# Patient Record
Sex: Male | Born: 1970 | Race: White | Hispanic: No | Marital: Married
Health system: Southern US, Community
[De-identification: ages and names within clinical notes are randomized; demographics above are authoritative.]

## PROBLEM LIST (undated history)

## (undated) DIAGNOSIS — L309 Dermatitis, unspecified: Secondary | ICD-10-CM

## (undated) DIAGNOSIS — E7800 Pure hypercholesterolemia, unspecified: Secondary | ICD-10-CM

## (undated) DIAGNOSIS — M549 Dorsalgia, unspecified: Secondary | ICD-10-CM

## (undated) DIAGNOSIS — K5090 Crohn's disease, unspecified, without complications: Secondary | ICD-10-CM

## (undated) DIAGNOSIS — J302 Other seasonal allergic rhinitis: Secondary | ICD-10-CM

## (undated) DIAGNOSIS — G43909 Migraine, unspecified, not intractable, without status migrainosus: Secondary | ICD-10-CM

## (undated) DIAGNOSIS — F329 Major depressive disorder, single episode, unspecified: Secondary | ICD-10-CM

## (undated) DIAGNOSIS — R7989 Other specified abnormal findings of blood chemistry: Secondary | ICD-10-CM

## (undated) DIAGNOSIS — M542 Cervicalgia: Secondary | ICD-10-CM

## (undated) DIAGNOSIS — I10 Essential (primary) hypertension: Secondary | ICD-10-CM

## (undated) DIAGNOSIS — R7301 Impaired fasting glucose: Secondary | ICD-10-CM

## (undated) DIAGNOSIS — J45909 Unspecified asthma, uncomplicated: Secondary | ICD-10-CM

## (undated) DIAGNOSIS — E669 Obesity, unspecified: Secondary | ICD-10-CM

## (undated) HISTORY — DX: Pure hypercholesterolemia, unspecified: E78.00

## (undated) HISTORY — DX: Dorsalgia, unspecified: M54.9

## (undated) HISTORY — DX: Other seasonal allergic rhinitis: J30.2

## (undated) HISTORY — DX: Obesity, unspecified: E66.9

## (undated) HISTORY — DX: Essential (primary) hypertension: I10

## (undated) HISTORY — DX: Migraine, unspecified, not intractable, without status migrainosus: G43.909

## (undated) HISTORY — DX: Dermatitis, unspecified: L30.9

## (undated) HISTORY — DX: Impaired fasting glucose: R73.01

## (undated) HISTORY — DX: Unspecified asthma, uncomplicated: J45.909

## (undated) HISTORY — DX: Major depressive disorder, single episode, unspecified: F32.9

## (undated) HISTORY — DX: Cervicalgia: M54.2

## (undated) HISTORY — DX: Crohn's disease, unspecified, without complications: K50.90

## (undated) HISTORY — DX: Other specified abnormal findings of blood chemistry: R79.89

---

## 2008-09-03 HISTORY — PX: COLONOSCOPY: SHX000273

## 2016-07-24 ENCOUNTER — Telehealth (HOSPITAL_BASED_OUTPATIENT_CLINIC_OR_DEPARTMENT_OTHER): Payer: Self-pay | Admitting: Family Medicine

## 2016-07-24 MED ORDER — TESTOSTERONE CYPIONATE 200 MG/ML INTRAMUSCULAR OIL
130.00 mg | TOPICAL_OIL | INTRAMUSCULAR | 0 refills | Status: DC
Start: 2016-07-24 — End: 2016-10-26

## 2016-07-24 NOTE — Telephone Encounter (Signed)
Pt states he was flying back yesterday and had his vial of testosterone in his carry on luggage and the vial broke. He is wondering if you would be able to refill? Last refill was 05/04/16 x5

## 2016-07-24 NOTE — Telephone Encounter (Signed)
Let him know that I normally do not authorize early refill as this is a controlled substance.  Will do it one time for him.  He will likely need to pay cash as it means getting more than authorized per month by ins.   Will fax rx for one additional vial.

## 2016-07-25 NOTE — Telephone Encounter (Signed)
Pt.notified

## 2016-07-30 ENCOUNTER — Other Ambulatory Visit (HOSPITAL_BASED_OUTPATIENT_CLINIC_OR_DEPARTMENT_OTHER): Payer: Self-pay | Admitting: Family Medicine

## 2016-07-30 NOTE — Telephone Encounter (Signed)
Last appt 04/27/16,next appt N/A, last refill 05/11/16 #90

## 2016-08-10 ENCOUNTER — Other Ambulatory Visit (HOSPITAL_BASED_OUTPATIENT_CLINIC_OR_DEPARTMENT_OTHER): Payer: Self-pay | Admitting: Family Medicine

## 2016-08-10 NOTE — Telephone Encounter (Signed)
Last appt 04/27/16, next appt N/A, last refill 08/17/15 #90 x3, last lab 04/20/16

## 2016-09-21 ENCOUNTER — Encounter (HOSPITAL_BASED_OUTPATIENT_CLINIC_OR_DEPARTMENT_OTHER): Payer: Self-pay | Admitting: Family Medicine

## 2016-09-21 ENCOUNTER — Ambulatory Visit (HOSPITAL_BASED_OUTPATIENT_CLINIC_OR_DEPARTMENT_OTHER): Payer: BLUE CROSS/BLUE SHIELD | Admitting: Family Medicine

## 2016-09-21 VITALS — BP 132/78 | HR 80 | Temp 97.8°F | Resp 9 | Ht 70.0 in | Wt 241.0 lb

## 2016-09-21 DIAGNOSIS — F32A Depression, unspecified: Secondary | ICD-10-CM | POA: Insufficient documentation

## 2016-09-21 DIAGNOSIS — E669 Obesity, unspecified: Secondary | ICD-10-CM | POA: Insufficient documentation

## 2016-09-21 DIAGNOSIS — R7989 Other specified abnormal findings of blood chemistry: Secondary | ICD-10-CM | POA: Insufficient documentation

## 2016-09-21 DIAGNOSIS — J040 Acute laryngitis: Principal | ICD-10-CM

## 2016-09-21 DIAGNOSIS — F329 Major depressive disorder, single episode, unspecified: Secondary | ICD-10-CM

## 2016-09-21 DIAGNOSIS — J302 Other seasonal allergic rhinitis: Secondary | ICD-10-CM | POA: Insufficient documentation

## 2016-09-21 DIAGNOSIS — L309 Dermatitis, unspecified: Secondary | ICD-10-CM | POA: Insufficient documentation

## 2016-09-21 DIAGNOSIS — K5090 Crohn's disease, unspecified, without complications: Secondary | ICD-10-CM | POA: Insufficient documentation

## 2016-09-21 DIAGNOSIS — J45909 Unspecified asthma, uncomplicated: Secondary | ICD-10-CM | POA: Insufficient documentation

## 2016-09-21 DIAGNOSIS — M549 Dorsalgia, unspecified: Secondary | ICD-10-CM | POA: Insufficient documentation

## 2016-09-21 DIAGNOSIS — G43909 Migraine, unspecified, not intractable, without status migrainosus: Secondary | ICD-10-CM | POA: Insufficient documentation

## 2016-09-21 DIAGNOSIS — M542 Cervicalgia: Secondary | ICD-10-CM | POA: Insufficient documentation

## 2016-09-21 DIAGNOSIS — E7800 Pure hypercholesterolemia, unspecified: Secondary | ICD-10-CM | POA: Insufficient documentation

## 2016-09-21 DIAGNOSIS — I10 Essential (primary) hypertension: Secondary | ICD-10-CM | POA: Insufficient documentation

## 2016-09-21 HISTORY — DX: Dermatitis, unspecified: L30.9

## 2016-09-21 HISTORY — DX: Crohn's disease, unspecified, without complications: K50.90

## 2016-09-21 HISTORY — DX: Essential (primary) hypertension: I10

## 2016-09-21 HISTORY — DX: Other specified abnormal findings of blood chemistry: R79.89

## 2016-09-21 HISTORY — DX: Obesity, unspecified: E66.9

## 2016-09-21 HISTORY — DX: Depression, unspecified: F32.A

## 2016-09-21 HISTORY — DX: Pure hypercholesterolemia, unspecified: E78.00

## 2016-09-21 HISTORY — DX: Other seasonal allergic rhinitis: J30.2

## 2016-09-21 HISTORY — DX: Dorsalgia, unspecified: M54.9

## 2016-09-21 HISTORY — DX: Cervicalgia: M54.2

## 2016-09-21 HISTORY — DX: Unspecified asthma, uncomplicated: J45.909

## 2016-09-21 HISTORY — DX: Migraine, unspecified, not intractable, without status migrainosus: G43.909

## 2016-09-21 LAB — POC RAPID STREP A: POC RAPID STREP A: NEGATIVE

## 2016-09-21 MED ORDER — FLUTICASONE PROPIONATE 50 MCG/ACTUATION NASAL SPRAY,SUSPENSION
1.00 | Freq: Every day | NASAL | 3 refills | Status: AC
Start: 2016-09-21 — End: 2017-09-16

## 2016-09-21 NOTE — Patient Instructions (Signed)
Laryngitis: Care Instructions  Your Care Instructions     Laryngitis is an inflammation of the voice box (larynx) that causes your voice to become raspy or hoarse. It can be short-lived or long-lasting. Most of the time, laryngitis comes on quickly and lasts as long as 2 weeks. It is caused by overuse, irritation, or infection of the vocal cords inside the larynx.  Some of the most common causes are a cold, the flu, or allergies. Loud talking, shouting, cheering, or singing also can cause laryngitis. Stomach acid that backs up into the throat also can make you lose your voice.  Resting your voice and taking other steps at home can help you get your voice back.  Follow-up care is a key part of your treatment and safety. Be sure to make and go to all appointments, and call your doctor if you are having problems. It's also a good idea to know your test results and keep a list of the medicines you take.  How can you care for yourself at home?   Before you use cough and cold medicines, check the label. They may not be safe for young children or for people with certain health problems.   Try to keep stomach acid from backing up into your throat. Do not eat just before bedtime. Reduce the amount of coffee and alcohol you drink, and eat healthy foods. Taking over-the-counter acid reducers can help when these steps are not enough. In some cases, you may need prescription medicine.   Rest your voice. You do not have to stop speaking, but use your voice as little as possible. Speak softly but do not whisper; whispering can bother your larynx more than speaking softly. Avoid talking on the telephone or trying to speak loudly.   Try not to clear your throat. This can cause more irritation of your larynx. Take an over-the-counter cough suppressant (if your doctor recommends it) if you have a dry cough that does not produce mucus.   Do not smoke or allow others to smoke around you. If you need help quitting, talk to your  doctor about stop-smoking programs and medicines. These can increase your chances of quitting for good.   Use a humidifier to add moisture to your bedroom. Humidity helps to thin the mucus in the nasal membranes that causes stuffiness or postnasal drip. Follow the directions for cleaning the machine.   Drink plenty of fluids, enough so that your urine is light yellow or clear like water. If you have kidney, heart, or liver disease and have to limit fluids, talk with your doctor before you increase the amount of fluids you drink.   Relieve nasal stuffiness. A saline (saltwater) nasal wash may help. You can buy saline nose drops at a grocery store or drugstore. Or you can make your own by adding 1 teaspoon of salt and 1 teaspoon of baking soda to 2 cups of distilled water. If you make your own, fill a bulb syringe with the solution, insert the tip into your nostril, and squeeze gently. Blow your nose.  When should you call for help?  Call your doctor now or seek immediate medical care if:   You have severe pain.   You have trouble swallowing.   You cough up blood.  Watch closely for changes in your health, and be sure to contact your doctor if:   Your voice does not get better after 2 weeks of care at home.   Where can you learn more?  Go to http://blackburn.com/  Enter A905 in the search box to learn more about "Laryngitis: Care Instructions."    2006-2015 [employer]. Care instructions adapted under license by [facility]. This care instruction is for use with your licensed healthcare professional. If you have questions about a medical condition or this instruction, always ask your healthcare professional. [employer] any warranty or liability for your use of this information.  Content Version: 10.6.465758; Current as of: December 17, 2013

## 2016-09-21 NOTE — Progress Notes (Signed)
Subjective:    Patient ID: Troy Valencia is a 23yr male with PMhx HTN p/w cough    Pt states that since Sunday he has been having cough, sore throat, hoarseness in voice, runny nose, myalgias.  No fever, no vomiting or diarrhea, no CP, no SOB    Review of Systems   Constitutional: Negative for fever.   HENT: Positive for sore throat.    Musculoskeletal: Positive for myalgias.       Objective:    BP 132/78  Pulse 80  Temp 36.6 C (97.8 F) (Tympanic)  Resp 9  Ht 1.778 m (5\' 10" )  Wt 109.3 kg (241 lb)  SpO2 97%  BMI 34.58 kg/m2  Physical Exam   Constitutional: He is oriented to person, place, and time. He appears distressed.   HENT:   Nose: Right sinus exhibits no maxillary sinus tenderness and no frontal sinus tenderness. Left sinus exhibits no maxillary sinus tenderness and no frontal sinus tenderness.   - tonsils wnl  - postnasal drip present   Cardiovascular: Normal rate.    No murmur heard.  Pulmonary/Chest: Effort normal and breath sounds normal. No respiratory distress. He has no wheezes. He has no rales.   Neurological: He is alert and oriented to person, place, and time.   Nursing note and vitals reviewed.       Assessment/Plan:    [name] was seen today for cough and sore throat.    Diagnoses and all orders for this visit:    Acute laryngitis  -     Fluticasone (FLONASE) 50 mcg/actuation nasal spray; Instill 1 spray into EACH nostril every day.  -     POC RAPID STREP A  - Rapid strep negative  - Advised pt to rest voice as much as possible  - will give flonase  - I advised pt to stay hydrated and rest as much as possible as there is possibility he may have flu, however symptoms have been ongoing for more than 48 hours so tamiflu unlikely to be  effective.   - f/u if symptoms worsen

## 2016-09-25 ENCOUNTER — Other Ambulatory Visit (HOSPITAL_BASED_OUTPATIENT_CLINIC_OR_DEPARTMENT_OTHER): Payer: Self-pay | Admitting: Family Medicine

## 2016-09-26 NOTE — Telephone Encounter (Signed)
Last appt 04/27/16, next appt N/A

## 2016-10-25 ENCOUNTER — Other Ambulatory Visit (HOSPITAL_BASED_OUTPATIENT_CLINIC_OR_DEPARTMENT_OTHER): Payer: Self-pay | Admitting: Family Medicine

## 2016-10-25 NOTE — Telephone Encounter (Signed)
Last appt 04/27/16  No future appts    Metoprolol Succ ER 100mg   LW 04/30/16, 90x1    Venlafaxine HCL ER 150mg   LW 04/30/16, 90x1    Amlodipine 5mg   LW 04/30/16, 90x1    Sertraline HCL 100mg   LW 04/30/16, 90x1    Losartan/HCTZ 100-12.5mg   LW 04/30/16, 90x1  Forwarding to Dr. Arvilla Market as Dr.H out of office.

## 2016-10-26 ENCOUNTER — Other Ambulatory Visit (HOSPITAL_BASED_OUTPATIENT_CLINIC_OR_DEPARTMENT_OTHER): Payer: Self-pay | Admitting: Family Medicine

## 2016-10-26 DIAGNOSIS — R7989 Other specified abnormal findings of blood chemistry: Principal | ICD-10-CM

## 2016-10-26 NOTE — Telephone Encounter (Signed)
Refilled as requested x 1 m.  Please schedule Troy Valencia for follow up appointment.  Thank you   Needs testosterone level and PSA labs (non-fasting).

## 2016-10-26 NOTE — Telephone Encounter (Signed)
Last appt 04/27/16, next appt N/A, last refill 07/24/16, last labs 11/22/15 (testosterone levels)  Sent to covering provider

## 2016-11-13 NOTE — Telephone Encounter (Signed)
Left message to call back  And make appt and to do labs

## 2016-11-30 ENCOUNTER — Other Ambulatory Visit (HOSPITAL_BASED_OUTPATIENT_CLINIC_OR_DEPARTMENT_OTHER): Payer: Self-pay | Admitting: Family Medicine

## 2016-11-30 NOTE — Telephone Encounter (Signed)
Last written 9.27.17  lov 8.25.17  No f/u

## 2016-12-09 ENCOUNTER — Other Ambulatory Visit (HOSPITAL_BASED_OUTPATIENT_CLINIC_OR_DEPARTMENT_OTHER): Payer: Self-pay | Admitting: Family Medicine

## 2016-12-11 ENCOUNTER — Encounter (HOSPITAL_BASED_OUTPATIENT_CLINIC_OR_DEPARTMENT_OTHER): Payer: Self-pay | Admitting: Family Medicine

## 2016-12-11 DIAGNOSIS — R7301 Impaired fasting glucose: Secondary | ICD-10-CM | POA: Insufficient documentation

## 2016-12-11 NOTE — Telephone Encounter (Signed)
lov 8.25.17  No f/u  8.18.17 A1c 6.1

## 2017-01-18 ENCOUNTER — Other Ambulatory Visit (HOSPITAL_BASED_OUTPATIENT_CLINIC_OR_DEPARTMENT_OTHER): Payer: Self-pay | Admitting: Family Medicine

## 2017-01-18 DIAGNOSIS — E7800 Pure hypercholesterolemia, unspecified: Principal | ICD-10-CM

## 2017-01-18 DIAGNOSIS — R7301 Impaired fasting glucose: Secondary | ICD-10-CM

## 2017-01-18 DIAGNOSIS — E119 Type 2 diabetes mellitus without complications: Secondary | ICD-10-CM

## 2017-01-18 NOTE — Telephone Encounter (Signed)
Last o/v with you 04/27/16  Saw Dr. Allena Katz 09/21/16, because we were full.

## 2017-01-22 MED ORDER — METOPROLOL SUCCINATE ER 100 MG TABLET,EXTENDED RELEASE 24 HR
100.00 mg | EXTENDED_RELEASE_TABLET | Freq: Every day | ORAL | 0 refills | Status: AC
Start: 2017-01-22 — End: 2017-04-22

## 2017-01-22 MED ORDER — METFORMIN 1,000 MG TABLET
1000.00 mg | ORAL_TABLET | Freq: Two times a day (BID) | ORAL | 0 refills | Status: DC
Start: 2017-01-22 — End: 2017-02-18

## 2017-01-22 MED ORDER — BUPROPION HCL SR 150 MG TABLET,12 HR SUSTAINED-RELEASE
150.00 mg | ORAL_TABLET | Freq: Two times a day (BID) | ORAL | 1 refills | Status: AC
Start: 2017-01-22 — End: 2017-07-21

## 2017-01-22 MED ORDER — AMLODIPINE 5 MG TABLET
5.00 mg | ORAL_TABLET | Freq: Every day | ORAL | 0 refills | Status: AC
Start: 2017-01-22 — End: 2017-04-22

## 2017-01-22 MED ORDER — ATORVASTATIN 10 MG TABLET
10.00 mg | ORAL_TABLET | Freq: Every day | ORAL | 1 refills | Status: AC
Start: 2017-01-22 — End: 2018-01-17

## 2017-01-22 NOTE — Telephone Encounter (Signed)
lvm advising of the below,needs appt for Phe, and fasting labs done prior

## 2017-01-22 NOTE — Telephone Encounter (Signed)
Refills sent.  Please ask (schedule) pt for physical,  get fasting labs done then f/u in clinic in the next 4-6 weeks.  Thanks.

## 2017-01-22 NOTE — Telephone Encounter (Signed)
Pt is out, would like call when ready

## 2017-02-18 ENCOUNTER — Other Ambulatory Visit (HOSPITAL_BASED_OUTPATIENT_CLINIC_OR_DEPARTMENT_OTHER): Payer: Self-pay | Admitting: Family Medicine

## 2017-02-18 MED ORDER — LOSARTAN 100 MG-HYDROCHLOROTHIAZIDE 12.5 MG TABLET
1.00 | ORAL_TABLET | Freq: Every day | ORAL | 0 refills | Status: AC
Start: 2017-02-18 — End: 2017-03-20

## 2017-02-18 MED ORDER — METFORMIN 1,000 MG TABLET
1000.00 mg | ORAL_TABLET | Freq: Two times a day (BID) | ORAL | 0 refills | Status: AC
Start: 2017-02-18 — End: 2017-03-20

## 2017-02-18 MED ORDER — SERTRALINE 100 MG TABLET
100.00 mg | ORAL_TABLET | Freq: Every day | ORAL | 0 refills | Status: AC
Start: 2017-02-18 — End: 2017-03-20

## 2017-02-18 MED ORDER — VENLAFAXINE ER 150 MG CAPSULE,EXTENDED RELEASE 24 HR
150.00 mg | EXTENDED_RELEASE_CAPSULE | Freq: Every day | ORAL | 0 refills | Status: AC
Start: 2017-02-18 — End: 2017-03-20

## 2017-02-18 MED ORDER — MESALAMINE 1.2 GRAM TABLET,DELAYED RELEASE
2.40 g | DELAYED_RELEASE_TABLET | Freq: Every day | ORAL | 0 refills | Status: AC
Start: 2017-02-18 — End: 2017-03-20

## 2017-02-18 NOTE — Telephone Encounter (Signed)
Pt has moved to [place], he has an appt with his new Dr in 2 weeks but he has ran out of medication and is wondering if he can get a 30 day supply to hold him over. Please send to [employer] [facility]. Last appt 04/27/16, phar updated. Sent to covering provider

## 2017-02-19 ENCOUNTER — Other Ambulatory Visit (HOSPITAL_BASED_OUTPATIENT_CLINIC_OR_DEPARTMENT_OTHER): Payer: Self-pay | Admitting: Family Medicine

## 2017-02-19 DIAGNOSIS — R7989 Other specified abnormal findings of blood chemistry: Principal | ICD-10-CM

## 2017-02-19 MED ORDER — POTASSIUM CHLORIDE ER 10 MEQ CAPSULE,EXTENDED RELEASE
10.00 meq | EXTENDED_RELEASE_CAPSULE | Freq: Every day | ORAL | 0 refills | Status: AC
Start: 2017-02-19 — End: 2017-03-21

## 2017-02-19 MED ORDER — FENOFIBRATE NANOCRYSTALLIZED 145 MG TABLET
1.00 | ORAL_TABLET | Freq: Every day | ORAL | 0 refills | Status: AC
Start: 2017-02-19 — End: 2017-03-21

## 2017-02-19 MED ORDER — TESTOSTERONE CYPIONATE 200 MG/ML INTRAMUSCULAR OIL
TOPICAL_OIL | INTRAMUSCULAR | 0 refills | Status: AC
Start: 2017-02-19 — End: 2018-02-19

## 2017-02-19 NOTE — Telephone Encounter (Signed)
Pt called and wanted to know the status of this.

## 2017-02-19 NOTE — Telephone Encounter (Signed)
Pt called yesterday and got some medication refilled, he moved to [place] and has ran out of all medication and has an appt with his new doctor next month. Pt states he needs 3 more that he forgot about yesterday. Sending to covering provider.

## 2017-02-20 NOTE — Telephone Encounter (Signed)
done

## 2018-11-18 ENCOUNTER — Emergency Department (HOSPITAL_COMMUNITY): Payer: Worker's Compensation

## 2018-11-18 ENCOUNTER — Other Ambulatory Visit: Payer: Self-pay

## 2018-11-18 ENCOUNTER — Emergency Department (HOSPITAL_COMMUNITY)
Admission: EM | Admit: 2018-11-18 | Discharge: 2018-11-18 | Disposition: A | Discharge status: Home / Self Care | Payer: Worker's Compensation | Attending: Emergency Medicine | Admitting: Emergency Medicine

## 2018-11-18 DIAGNOSIS — Y92410 Unspecified street and highway as the place of occurrence of the external cause: Secondary | ICD-10-CM | POA: Diagnosis not present

## 2018-11-18 DIAGNOSIS — R0789 Other chest pain: Secondary | ICD-10-CM | POA: Insufficient documentation

## 2018-11-18 DIAGNOSIS — Y999 Unspecified external cause status: Secondary | ICD-10-CM | POA: Diagnosis not present

## 2018-11-18 DIAGNOSIS — Z23 Encounter for immunization: Secondary | ICD-10-CM | POA: Diagnosis not present

## 2018-11-18 DIAGNOSIS — S39012A Strain of muscle, fascia and tendon of lower back, initial encounter: Secondary | ICD-10-CM | POA: Diagnosis not present

## 2018-11-18 DIAGNOSIS — S161XXA Strain of muscle, fascia and tendon at neck level, initial encounter: Secondary | ICD-10-CM | POA: Diagnosis not present

## 2018-11-18 DIAGNOSIS — R51 Headache: Secondary | ICD-10-CM | POA: Insufficient documentation

## 2018-11-18 DIAGNOSIS — T07XXXA Unspecified multiple injuries, initial encounter: Secondary | ICD-10-CM | POA: Diagnosis not present

## 2018-11-18 DIAGNOSIS — S199XXA Unspecified injury of neck, initial encounter: Secondary | ICD-10-CM | POA: Diagnosis present

## 2018-11-18 DIAGNOSIS — Y9389 Activity, other specified: Secondary | ICD-10-CM | POA: Insufficient documentation

## 2018-11-18 LAB — BASIC METABOLIC PANEL
Anion gap: 7 (ref 5–15)
BUN: 27 mg/dL — ABNORMAL HIGH (ref 6–20)
CO2: 23 mmol/L (ref 22–32)
Calcium: 9.2 mg/dL (ref 8.9–10.3)
Chloride: 106 mmol/L (ref 98–111)
Creatinine, Ser: 1.53 mg/dL — ABNORMAL HIGH (ref 0.61–1.24)
GFR calc non Af Amer: 53 mL/min — ABNORMAL LOW (ref >60)
Glucose, Bld: 125 mg/dL — ABNORMAL HIGH (ref 70–99)
Potassium: 4.3 mmol/L (ref 3.5–5.1)
Sodium: 136 mmol/L (ref 135–145)

## 2018-11-18 LAB — CBC
HCT: 33.6 % — ABNORMAL LOW (ref 39.0–52.0)
HEMOGLOBIN: 11.2 g/dL — AB (ref 13.0–17.0)
MCH: 31.6 pg (ref 26.0–34.0)
MCHC: 33.3 g/dL (ref 30.0–36.0)
MCV: 94.9 fL (ref 80.0–100.0)
NRBC: 0 % (ref 0.0–0.2)
Platelets: 248 10*3/uL (ref 150–400)
RBC: 3.54 MIL/uL — ABNORMAL LOW (ref 4.22–5.81)
RDW: 11.7 % (ref 11.5–15.5)
WBC: 5.6 10*3/uL (ref 4.0–10.5)

## 2018-11-18 MED ORDER — TETANUS-DIPHTH-ACELL PERTUSSIS 5-2.5-18.5 LF-MCG/0.5 IM SUSP
0.5000 mL | Freq: Once | INTRAMUSCULAR | Status: AC
  Administered 2018-11-18: 0.5 mL via INTRAMUSCULAR
  Filled 2018-11-18: qty 0.5

## 2018-11-18 MED ORDER — SODIUM CHLORIDE 0.9 % IV SOLN
INTRAVENOUS | Status: DC
  Administered 2018-11-18: 09:00:00 via INTRAVENOUS

## 2018-11-18 MED ORDER — ONDANSETRON HCL 4 MG/2ML IJ SOLN
4.0000 mg | Freq: Once | INTRAMUSCULAR | Status: AC
  Administered 2018-11-18: 4 mg via INTRAVENOUS
  Filled 2018-11-18: qty 2

## 2018-11-18 MED ORDER — HYDROCODONE-ACETAMINOPHEN 5-325 MG PO TABS: 1.0000 | ORAL_TABLET | Freq: Four times a day (QID) | ORAL | 0 refills | Status: AC | PRN

## 2018-11-18 MED ORDER — FENTANYL CITRATE (PF) 100 MCG/2ML IJ SOLN
25.0000 ug | Freq: Once | INTRAMUSCULAR | Status: AC
  Administered 2018-11-18: 25 ug via INTRAVENOUS
  Filled 2018-11-18: qty 2

## 2018-11-18 MED ORDER — IOPAMIDOL (ISOVUE-300) INJECTION 61%
100.0000 mL | Freq: Once | INTRAVENOUS | Status: AC | PRN
  Administered 2018-11-18: 100 mL via INTRAVENOUS
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
## 2018-11-18 NOTE — Discharge Instructions (Addendum)
Work note provided to be out of work until Friday.  Rest and relax at home expect to be very sore and stiff.  Take the hydrocodone as needed.  Recommend taking Motrin on a regular basis to help with the muscle soreness.  CT head neck chest abdomen and pelvis without any acute findings.  Plain x-rays of your left shoulder without any acute findings.
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
## 2024-06-29 ENCOUNTER — Other Ambulatory Visit: Payer: Self-pay | Admitting: Internal Medicine

## 2024-06-29 DIAGNOSIS — E221 Hyperprolactinemia: Secondary | ICD-10-CM

## 2024-06-29 DIAGNOSIS — R7989 Other specified abnormal findings of blood chemistry: Secondary | ICD-10-CM
